# Patient Record
Sex: Female | Born: 2000 | Race: White | Hispanic: No | Marital: Single | Smoking: Never smoker
Health system: Southern US, Community
[De-identification: ages and names within clinical notes are randomized; demographics above are authoritative.]

## PROBLEM LIST (undated history)

## (undated) DIAGNOSIS — F603 Borderline personality disorder: Secondary | ICD-10-CM

## (undated) DIAGNOSIS — F32A Depression, unspecified: Secondary | ICD-10-CM

## (undated) DIAGNOSIS — F419 Anxiety disorder, unspecified: Secondary | ICD-10-CM

---

## 2019-06-01 ENCOUNTER — Other Ambulatory Visit: Payer: Self-pay

## 2019-06-01 DIAGNOSIS — Z20822 Contact with and (suspected) exposure to covid-19: Secondary | ICD-10-CM

## 2019-06-02 LAB — NOVEL CORONAVIRUS, NAA: SARS-CoV-2, NAA: NOT DETECTED

## 2020-07-14 ENCOUNTER — Emergency Department
Admission: EM | Admit: 2020-07-14 | Discharge: 2020-07-14 | Disposition: A | Discharge status: Home / Self Care | Payer: PRIVATE HEALTH INSURANCE | Attending: Emergency Medicine | Admitting: Emergency Medicine

## 2020-07-14 ENCOUNTER — Emergency Department: Payer: PRIVATE HEALTH INSURANCE

## 2020-07-14 ENCOUNTER — Telehealth: Payer: Self-pay | Admitting: Emergency Medicine

## 2020-07-14 DIAGNOSIS — Z79899 Other long term (current) drug therapy: Secondary | ICD-10-CM | POA: Insufficient documentation

## 2020-07-14 DIAGNOSIS — R109 Unspecified abdominal pain: Secondary | ICD-10-CM | POA: Diagnosis present

## 2020-07-14 DIAGNOSIS — N200 Calculus of kidney: Secondary | ICD-10-CM | POA: Diagnosis not present

## 2020-07-14 HISTORY — DX: Borderline personality disorder: F60.3

## 2020-07-14 HISTORY — DX: Depression, unspecified: F32.A

## 2020-07-14 HISTORY — DX: Anxiety disorder, unspecified: F41.9

## 2020-07-14 LAB — URINALYSIS, COMPLETE (UACMP) WITH MICROSCOPIC
Bacteria, UA: NONE SEEN
Bilirubin Urine: NEGATIVE
Glucose, UA: NEGATIVE mg/dL
Ketones, ur: NEGATIVE mg/dL
Leukocytes,Ua: NEGATIVE
Nitrite: NEGATIVE
Protein, ur: 100 mg/dL — AB
RBC / HPF: >50 RBC/hpf — ABNORMAL HIGH (ref 0–5)
Specific Gravity, Urine: 1.018 (ref 1.005–1.030)
pH: 5 (ref 5.0–8.0)

## 2020-07-14 LAB — CBC WITH DIFFERENTIAL/PLATELET
Abs Immature Granulocytes: 0.03 10*3/uL (ref 0.00–0.07)
Basophils Absolute: 0 10*3/uL (ref 0.0–0.1)
Basophils Relative: 1 %
Eosinophils Absolute: 0.1 10*3/uL (ref 0.0–0.5)
Eosinophils Relative: 1 %
HCT: 41.5 % (ref 36.0–46.0)
Hemoglobin: 13.7 g/dL (ref 12.0–15.0)
Immature Granulocytes: 1 %
Lymphocytes Relative: 25 %
Lymphs Abs: 1.7 10*3/uL (ref 0.7–4.0)
MCH: 28.4 pg (ref 26.0–34.0)
MCHC: 33 g/dL (ref 30.0–36.0)
MCV: 86.1 fL (ref 80.0–100.0)
Monocytes Absolute: 0.5 10*3/uL (ref 0.1–1.0)
Monocytes Relative: 7 %
Neutro Abs: 4.3 10*3/uL (ref 1.7–7.7)
Neutrophils Relative %: 65 %
Platelets: 333 10*3/uL (ref 150–400)
RBC: 4.82 MIL/uL (ref 3.87–5.11)
RDW: 11.9 % (ref 11.5–15.5)
WBC: 6.6 10*3/uL (ref 4.0–10.5)
nRBC: 0 % (ref 0.0–0.2)

## 2020-07-14 LAB — BASIC METABOLIC PANEL
Anion gap: 6 (ref 5–15)
BUN: 9 mg/dL (ref 6–20)
CO2: 29 mmol/L (ref 22–32)
Calcium: 9.8 mg/dL (ref 8.9–10.3)
Chloride: 103 mmol/L (ref 98–111)
Creatinine, Ser: 0.85 mg/dL (ref 0.44–1.00)
GFR, Estimated: >60 mL/min (ref >60)
Glucose, Bld: 91 mg/dL (ref 70–99)
Potassium: 4.7 mmol/L (ref 3.5–5.1)
Sodium: 138 mmol/L (ref 135–145)

## 2020-07-14 LAB — PREGNANCY, URINE: Preg Test, Ur: NEGATIVE

## 2020-07-14 MED ORDER — TAMSULOSIN HCL 0.4 MG PO CAPS: 0.4000 mg | ORAL_CAPSULE | Freq: Every day | ORAL | 0 refills | Status: AC

## 2020-07-14 MED ORDER — SODIUM CHLORIDE 0.9 % IV BOLUS
1000.0000 mL | Freq: Once | INTRAVENOUS | Status: AC
  Administered 2020-07-14: 1000 mL via INTRAVENOUS

## 2020-07-14 MED ORDER — ONDANSETRON HCL 4 MG/2ML IJ SOLN
4.0000 mg | Freq: Once | INTRAMUSCULAR | Status: AC
  Administered 2020-07-14: 4 mg via INTRAVENOUS
  Filled 2020-07-14: qty 2

## 2020-07-14 MED ORDER — TAMSULOSIN HCL 0.4 MG PO CAPS: 0.4000 mg | ORAL_CAPSULE | Freq: Every day | ORAL | 0 refills | Status: DC

## 2020-07-14 MED ORDER — HYDROMORPHONE HCL 1 MG/ML IJ SOLN
0.5000 mg | Freq: Once | INTRAMUSCULAR | Status: AC
  Administered 2020-07-14: 0.5 mg via INTRAVENOUS
  Filled 2020-07-14: qty 1

## 2020-07-14 MED ORDER — IBUPROFEN 600 MG PO TABS: 600.0000 mg | ORAL_TABLET | Freq: Three times a day (TID) | ORAL | 0 refills | Status: DC | PRN

## 2020-07-14 MED ORDER — IBUPROFEN 600 MG PO TABS: 600.0000 mg | ORAL_TABLET | Freq: Three times a day (TID) | ORAL | 0 refills | Status: AC | PRN

## 2020-07-14 MED ORDER — KETOROLAC TROMETHAMINE 30 MG/ML IJ SOLN
15.0000 mg | Freq: Once | INTRAMUSCULAR | Status: AC
  Administered 2020-07-14: 15 mg via INTRAVENOUS
  Filled 2020-07-14: qty 1

## 2020-07-14 MED ORDER — ONDANSETRON 4 MG PO TBDP: 4.0000 mg | ORAL_TABLET | Freq: Three times a day (TID) | ORAL | 0 refills | Status: DC | PRN

## 2020-07-14 MED ORDER — CEPHALEXIN 500 MG PO CAPS: 500.0000 mg | ORAL_CAPSULE | Freq: Three times a day (TID) | ORAL | 0 refills | Status: DC

## 2020-07-14 MED ORDER — CEPHALEXIN 500 MG PO CAPS: 500.0000 mg | ORAL_CAPSULE | Freq: Three times a day (TID) | ORAL | 0 refills | Status: AC

## 2020-07-14 MED ORDER — ONDANSETRON 4 MG PO TBDP: 4.0000 mg | ORAL_TABLET | Freq: Three times a day (TID) | ORAL | 0 refills | Status: AC | PRN

## 2020-07-14 NOTE — ED Provider Notes (Signed)
[facility] Emergency Department Provider Note  ____________________________________________   First MD Initiated Contact with Patient 07/14/20 1152     (approximate)  I have reviewed the triage vital signs and the nursing notes.   HISTORY  Chief Complaint Flank Pain    HPI Sydney Brady is a 19 y.o. female with history of borderline personality disorder, recurrent kidney stones, here with suspected kidney stone.  The patient states her symptoms began fairly acutely this morning.  She experienced acute onset of left flank pain, which has now radiated more towards her left lower flank.  She said associated nausea but no vomiting.  She has a history of recurrent kidney stones, greater than 10, and has been able to pass these all without requiring intervention.  Denies any known fevers or chills.  She had no preceding urinary symptoms.  No other acute complaints.  No specific alleviating aggravating factors.        Past Medical History:  Diagnosis Date  . Anxiety   . Borderline personality disorder (HCC)   . Depression     There are no problems to display for this patient.   History reviewed. No pertinent surgical history.  Prior to Admission medications   Medication Sig Start Date End Date Taking? Authorizing Provider  buPROPion (WELLBUTRIN XL) 300 MG 24 hr tablet Take 300 mg by mouth at bedtime. 07/04/20  Yes [provider]  desvenlafaxine (PRISTIQ) 100 MG 24 hr tablet Take 100 mg by mouth daily. 06/15/20  Yes [provider]  NUVARING 0.12-0.015 MG/24HR vaginal ring INSERT 1 RING VAGINALLY FOR 3 WEEKS THEN REMOVE FOR 1 WEEK 06/12/20  Yes [provider]  traZODone (DESYREL) 50 MG tablet Take 50 mg by mouth at bedtime. 06/28/20  Yes [provider]  VYVANSE 60 MG capsule Take 60 mg by mouth at bedtime. 06/22/20  Yes [provider]  cephALEXin (KEFLEX) 500 MG capsule Take 1 capsule (500 mg total) by mouth  3 (three) times daily for 7 days. 07/14/20 07/21/20  Shaune Pollack, MD  ibuprofen (ADVIL) 600 MG tablet Take 1 tablet (600 mg total) by mouth every 8 (eight) hours as needed for moderate pain. 07/14/20   Shaune Pollack, MD  ondansetron (ZOFRAN ODT) 4 MG disintegrating tablet Take 1 tablet (4 mg total) by mouth every 8 (eight) hours as needed for nausea or vomiting. 07/14/20   Shaune Pollack, MD  tamsulosin (FLOMAX) 0.4 MG CAPS capsule Take 1 capsule (0.4 mg total) by mouth daily for 5 days. 07/14/20 07/19/20  Shaune Pollack, MD    Allergies Coconut fatty acids  No family history on file.  Social History Social History   Tobacco Use  . Smoking status: Never Smoker  . Smokeless tobacco: Never Used  Substance Use Topics  . Alcohol use: Not Currently  . Drug use: Never    Review of Systems  Review of Systems  Constitutional: Positive for fatigue. Negative for fever.  HENT: Negative for congestion and sore throat.   Eyes: Negative for visual disturbance.  Respiratory: Negative for cough and shortness of breath.   Cardiovascular: Negative for chest pain.  Gastrointestinal: Positive for nausea. Negative for abdominal pain, diarrhea and vomiting.  Genitourinary: Positive for flank pain and hematuria.  Musculoskeletal: Negative for back pain and neck pain.  Skin: Negative for rash and wound.  Neurological: Negative for weakness.  All other systems reviewed and are negative.    ____________________________________________  PHYSICAL EXAM:      VITAL SIGNS: ED  Triage Vitals  Enc Vitals Group     BP 07/14/20 0934 116/78     Pulse Rate 07/14/20 0934 (!) 105     Resp 07/14/20 0934 18     Temp 07/14/20 0934 98.5 F (36.9 C)     Temp Source 07/14/20 0934 Oral     SpO2 07/14/20 0934 96 %     Weight 07/14/20 0935 125 lb (56.7 kg)     Height 07/14/20 0935 5\' 3"  (1.6 m)     Head Circumference --      Peak Flow --      Pain Score 07/14/20 0935 2     Pain Loc --      Pain Edu? --        Excl. in GC? --      Physical Exam Vitals and nursing note reviewed.  Constitutional:      General: She is not in acute distress.    Appearance: She is well-developed.  HENT:     Head: Normocephalic and atraumatic.  Eyes:     Conjunctiva/sclera: Conjunctivae normal.  Cardiovascular:     Rate and Rhythm: Normal rate and regular rhythm.     Heart sounds: Normal heart sounds. No murmur heard.  No friction rub.  Pulmonary:     Effort: Pulmonary effort is normal. No respiratory distress.     Breath sounds: Normal breath sounds. No wheezing or rales.  Abdominal:     General: There is no distension.     Palpations: Abdomen is soft.     Tenderness: There is no abdominal tenderness.     Comments: Mild left flank tenderness, without overt CVA tenderness.  Musculoskeletal:     Cervical back: Neck supple.  Skin:    General: Skin is warm.     Capillary Refill: Capillary refill takes less than 2 seconds.  Neurological:     Mental Status: She is alert and oriented to person, place, and time.     Motor: No abnormal muscle tone.       ____________________________________________   LABS (all labs ordered are listed, but only abnormal results are displayed)  Labs Reviewed  URINALYSIS, COMPLETE (UACMP) WITH MICROSCOPIC - Abnormal; Notable for the following components:      Result Value   Color, Urine AMBER (*)    APPearance CLOUDY (*)    Hgb urine dipstick LARGE (*)    Protein, ur 100 (*)    RBC / HPF >50 (*)    All other components within normal limits  CBC WITH DIFFERENTIAL/PLATELET  BASIC METABOLIC PANEL  PREGNANCY, URINE    ____________________________________________  EKG:  ________________________________________  RADIOLOGY All imaging, including plain films, CT scans, and ultrasounds, independently reviewed by me, and interpretations confirmed via formal radiology reads.  ED MD interpretation:   Ultrasound renal: bilateral calculi, no hydro  Official  radiology report(s): 13/07/21 Renal  Result Date: 07/14/2020 CLINICAL DATA:  Left flank pain since 6 a.m. EXAM: RENAL / URINARY TRACT ULTRASOUND COMPLETE COMPARISON:  None. FINDINGS: Right Kidney: Renal measurements: 8.7 x 3.6 x 4.1 cm = volume: 69 mL. Echogenicity within normal limits. There is an echogenic focus in the inferior right kidney measuring 0.5 cm, likely a renal stone. No hydronephrosis. Left Kidney: Renal measurements: 9.2 x 5.3 x 4.5 cm = volume: 116 mL. Echogenicity within normal limits. There is an echogenic focus in the midpole measuring 0.6 cm which may represent a renal calculus. No hydronephrosis. Bladder: Appears normal for degree of bladder distention. Bilateral jets  visualized Other: None. IMPRESSION: Probable small bilateral renal calculi.  No hydronephrosis. Electronically Signed   By: Emmaline Kluver M.D.   On: 07/14/2020 13:48    ____________________________________________  PROCEDURES   Procedure(s) performed (including Critical Care):  Procedures  ____________________________________________  INITIAL IMPRESSION / MDM / ASSESSMENT AND PLAN / ED COURSE  As part of my medical decision making, I reviewed the following data within the electronic MEDICAL RECORD NUMBER Nursing notes reviewed and incorporated, Old chart reviewed, Notes from prior ED visits, and [place] Controlled Substance Database       *Sofie Schendel was evaluated in Emergency Department on 07/14/2020 for the symptoms described in the history of present illness. She was evaluated in the context of the global COVID-19 pandemic, which necessitated consideration that the patient might be at risk for infection with the SARS-CoV-2 virus that causes COVID-19. Institutional protocols and algorithms that pertain to the evaluation of patients at risk for COVID-19 are in a state of rapid change based on information released by regulatory bodies including the CDC and federal and state organizations. These policies and  algorithms were followed during the patient's care in the ED.  Some ED evaluations and interventions may be delayed as a result of limited staffing during the pandemic.*     Medical Decision Making: [age]-year-old female here with recurrent kidney stone.  History of similar episodes, with no history of prior instrumentation.  Patient is afebrile and hemodynamically stable.  She has no evidence of leukocytosis or infection.  She had no urinary symptoms prior to the onset of pain.  Renal function is normal.  Urinalysis does show significant hematuria and mild pyuria, but no bacteria and she has no fevers other signs suggest infection.  ____________________________________________  FINAL CLINICAL IMPRESSION(S) / ED DIAGNOSES  Final diagnoses:  Kidney stone     MEDICATIONS GIVEN DURING THIS VISIT:  Medications  sodium chloride 0.9 % bolus 1,000 mL (1,000 mLs Intravenous New Bag/Given 07/14/20 1317)  HYDROmorphone (DILAUDID) injection 0.5 mg (0.5 mg Intravenous Given 07/14/20 1316)  ondansetron (ZOFRAN) injection 4 mg (4 mg Intravenous Given 07/14/20 1315)  ketorolac (TORADOL) 30 MG/ML injection 15 mg (15 mg Intravenous Given 07/14/20 1314)     ED Discharge Orders         Ordered    tamsulosin (FLOMAX) 0.4 MG CAPS capsule  Daily        07/14/20 1350    ondansetron (ZOFRAN ODT) 4 MG disintegrating tablet  Every 8 hours PRN        07/14/20 1350    ibuprofen (ADVIL) 600 MG tablet  Every 8 hours PRN        07/14/20 1350    cephALEXin (KEFLEX) 500 MG capsule  3 times daily        07/14/20 1350           Note:  This document was prepared using Dragon voice recognition software and may include unintentional dictation errors.   Shaune Pollack, MD 07/14/20 1352

## 2020-07-14 NOTE — ED Triage Notes (Addendum)
Left flank pain and patient states "I have another kidney stone." States she has had several in the past and this is a kidney stone. Patient denies dysuria, urgency or frequency.

## 2020-07-14 NOTE — Discharge Instructions (Addendum)
Take the Ibuprofen and zofran as needed  Your urine did show a small amount of inflammation as well. This is likely related to the stone but in the case of infection, we will start an antibiotic to be safe.

## 2020-07-14 NOTE — ED Notes (Signed)
Ultrasound with pt Will start IV and give medications once U/S complete

## 2020-07-14 NOTE — Telephone Encounter (Signed)
Switched rx pharmacy

## 2020-07-19 ENCOUNTER — Other Ambulatory Visit: Payer: Self-pay | Admitting: Urology

## 2020-07-19 DIAGNOSIS — N200 Calculus of kidney: Secondary | ICD-10-CM

## 2020-07-22 ENCOUNTER — Other Ambulatory Visit: Payer: Self-pay

## 2020-07-22 ENCOUNTER — Ambulatory Visit
Admission: RE | Admit: 2020-07-22 | Discharge: 2020-07-22 | Disposition: A | Discharge status: Home / Self Care | Payer: PRIVATE HEALTH INSURANCE | Source: Ambulatory Visit | Attending: Urology | Admitting: Urology

## 2020-07-22 DIAGNOSIS — N200 Calculus of kidney: Secondary | ICD-10-CM

## 2020-12-03 ENCOUNTER — Other Ambulatory Visit: Payer: Self-pay | Admitting: Obstetrics & Gynecology

## 2020-12-03 ENCOUNTER — Other Ambulatory Visit: Payer: Self-pay | Admitting: Acute Care

## 2020-12-03 ENCOUNTER — Other Ambulatory Visit (HOSPITAL_COMMUNITY): Payer: Self-pay | Admitting: Obstetrics & Gynecology

## 2020-12-03 DIAGNOSIS — Z30433 Encounter for removal and reinsertion of intrauterine contraceptive device: Secondary | ICD-10-CM

## 2020-12-03 DIAGNOSIS — Z30430 Encounter for insertion of intrauterine contraceptive device: Secondary | ICD-10-CM

## 2020-12-16 ENCOUNTER — Ambulatory Visit: Admission: RE | Admit: 2020-12-16 | Payer: PRIVATE HEALTH INSURANCE | Source: Ambulatory Visit

## 2020-12-19 ENCOUNTER — Other Ambulatory Visit (HOSPITAL_COMMUNITY): Payer: Self-pay | Admitting: Obstetrics & Gynecology

## 2020-12-19 DIAGNOSIS — Z30430 Encounter for insertion of intrauterine contraceptive device: Secondary | ICD-10-CM

## 2020-12-19 DIAGNOSIS — Z30433 Encounter for removal and reinsertion of intrauterine contraceptive device: Secondary | ICD-10-CM

## 2021-01-09 ENCOUNTER — Other Ambulatory Visit: Payer: Self-pay

## 2022-04-17 IMAGING — CT CT RENAL STONE PROTOCOL
3 of 4 series · 9 of 46 positions shown, 16 images · non-contrast
Comparison: Renal ultrasound 07/14/2020 and abdominal radiographs
from 07/22/2020

CLINICAL DATA: Left flank pain.  Hematuria for 1 week.

EXAM:
CT ABDOMEN AND PELVIS WITHOUT CONTRAST
TECHNIQUE: Multidetector CT imaging of the abdomen and pelvis was performed
following the standard protocol without IV contrast.

[Series 4: lung bases · axial · 0.65mm/px · z∈[-197,-122]mm · 5 of 23 slices shown, 10 images]
[im 4/23  soft-tissue]
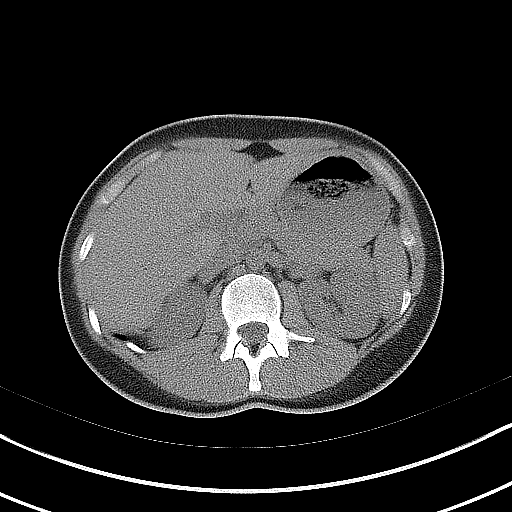
[im 4/23  bone]
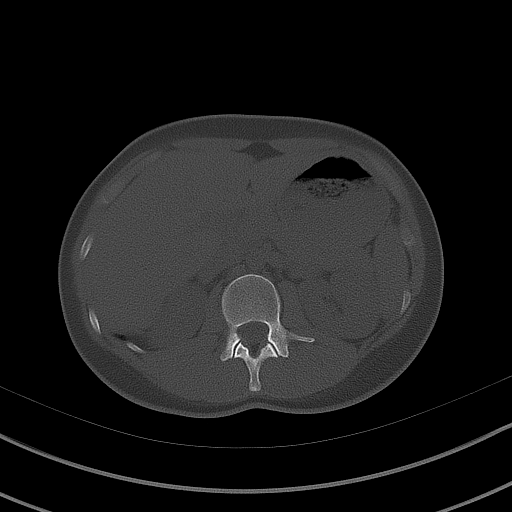
[im 8/23  soft-tissue]
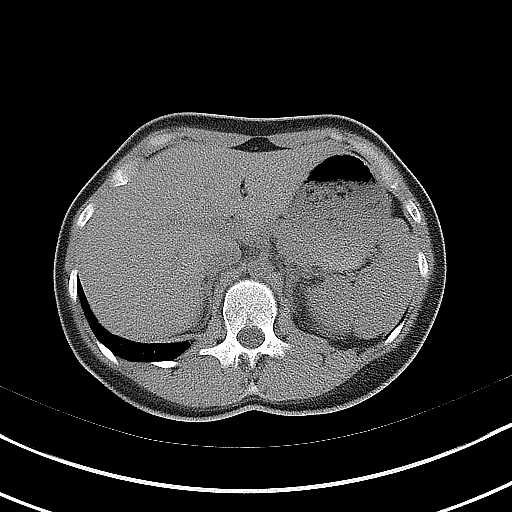
[im 8/23  lung]
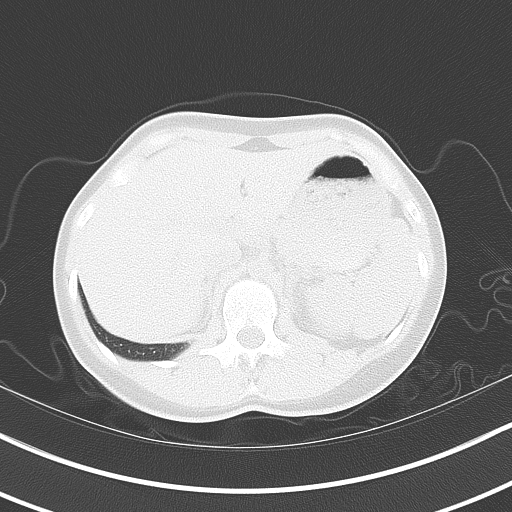
[im 12/23  soft-tissue]
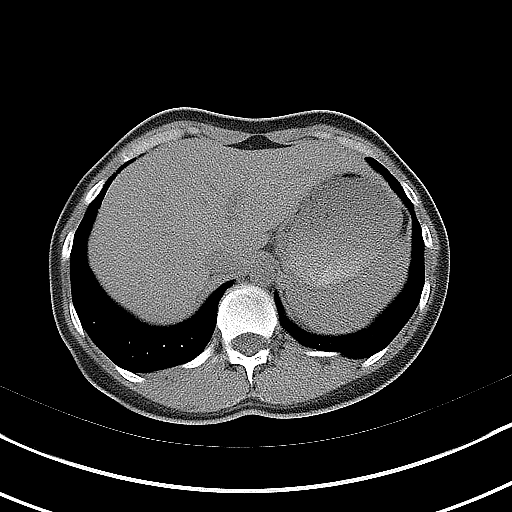
[im 12/23  lung]
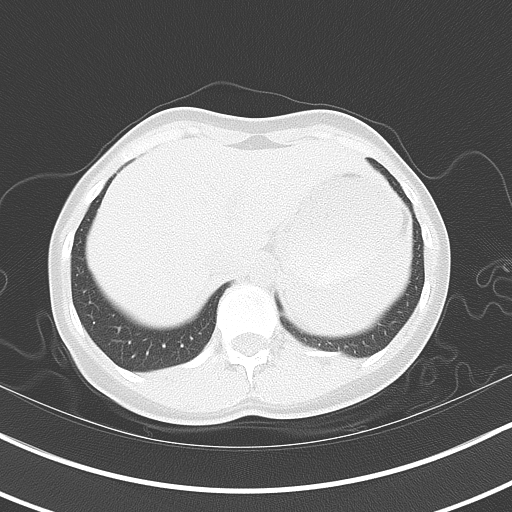
[im 15/23  soft-tissue]
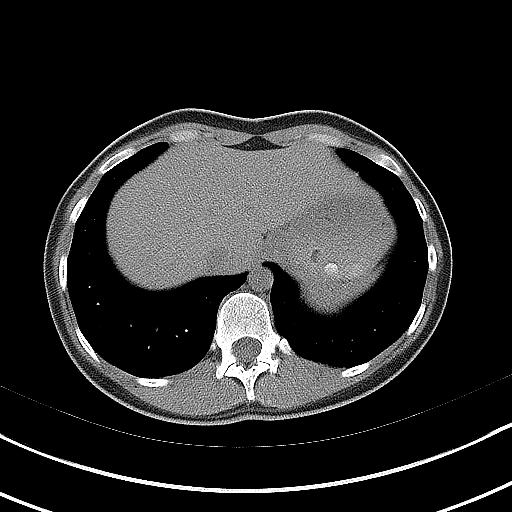
[im 15/23  lung]
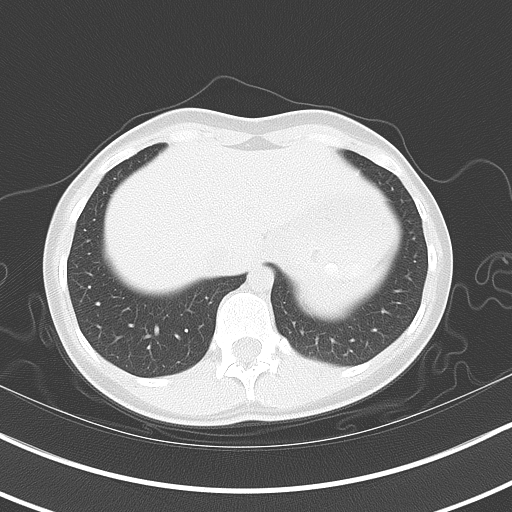
[im 19/23  soft-tissue]
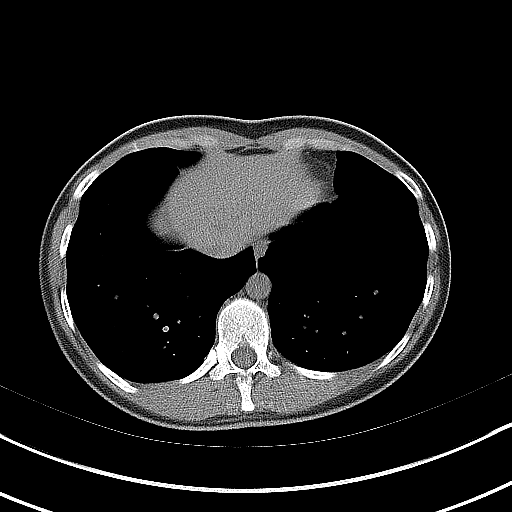
[im 19/23  lung]
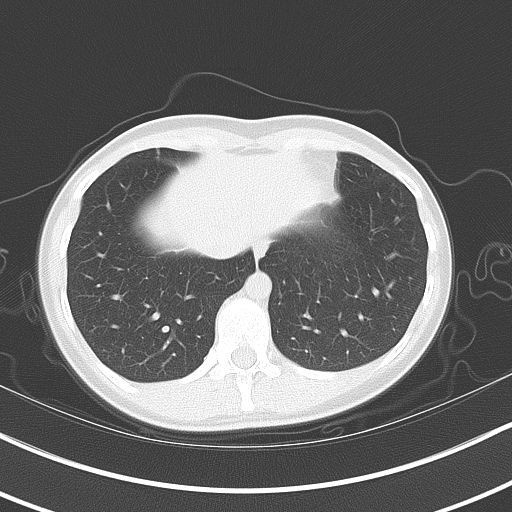

[Series 5: coronal · coronal · 0.68mm/px · 3 of 106 slices shown, 4 images]
[im 36/106  soft-tissue]
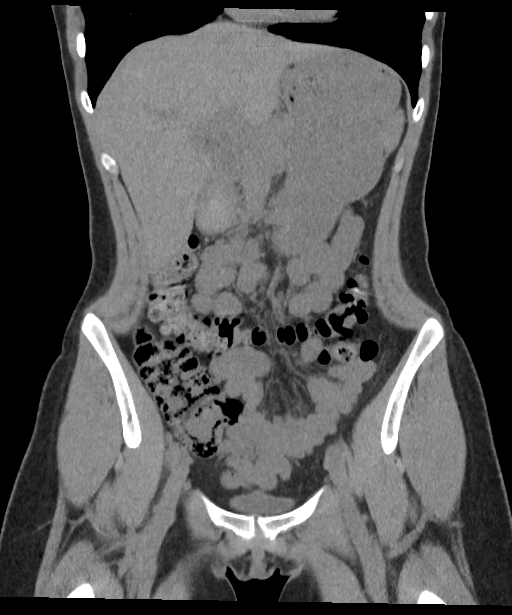
[im 47/106  soft-tissue]
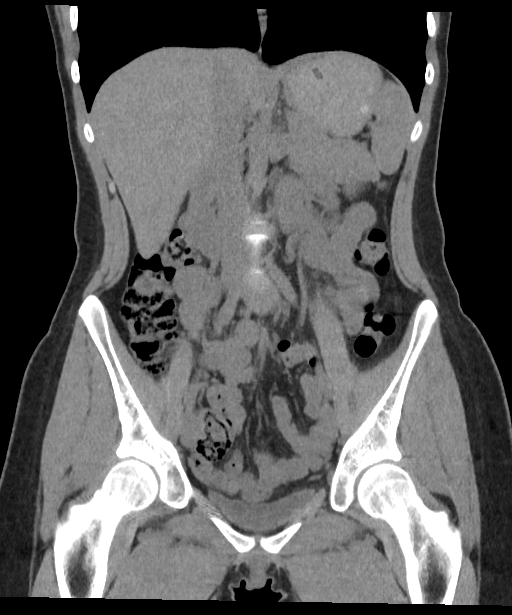
[im 47/106  bone]
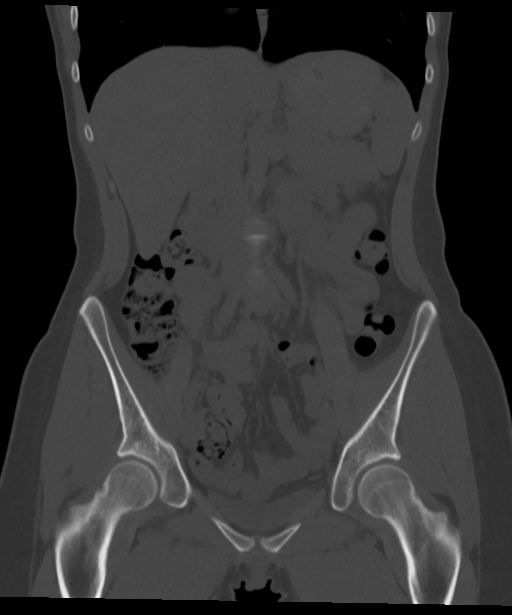
[im 59/106  soft-tissue]
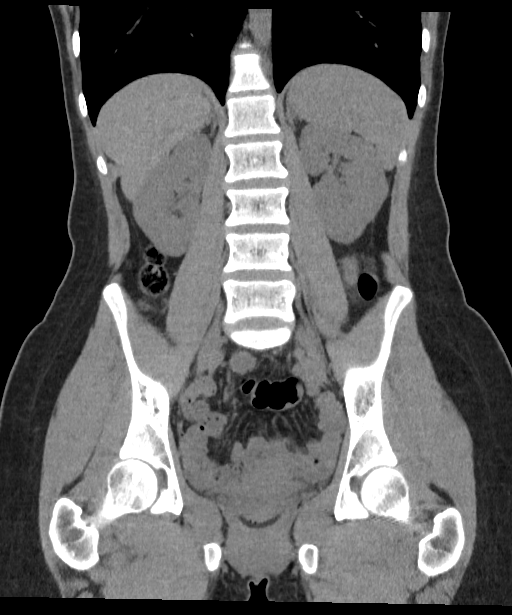

[Series 6: sagittal · sagittal · 0.47mm/px · 1 of 159 slices shown, 2 images]
[im 53/159  soft-tissue]
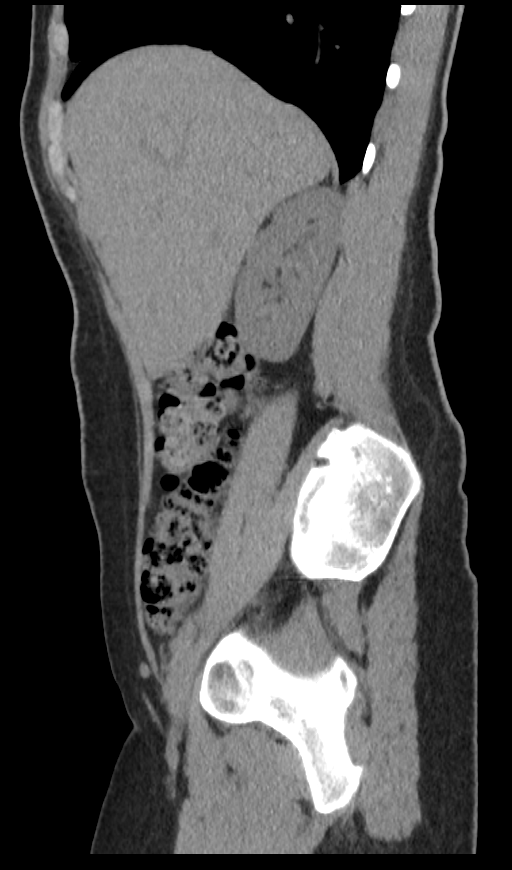
[im 53/159  bone]
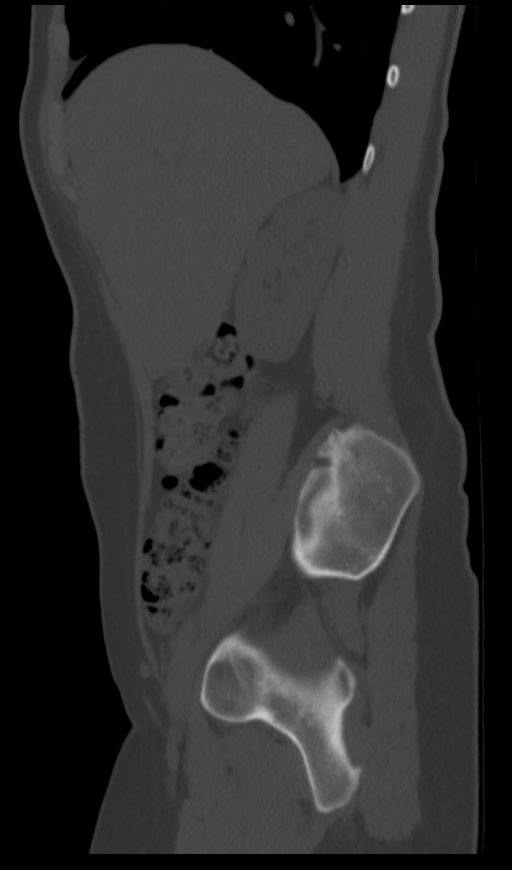

[9 of 46 positions shown; findings below may reference images not displayed]

FINDINGS: Lower chest: [DATE] by 0.3 by 0.4 cm subpleural nodule in the left
lower lobe on image 10 of series 4. Given the patient's age,
Fleischner criteria do not apply, and this lesion is considered
benign.

Hepatobiliary: Contracted gallbladder.  Otherwise unremarkable.

Pancreas: Unremarkable

Spleen: Unremarkable

Adrenals/Urinary Tract: Both adrenal glands appear normal. No
hydronephrosis or hydroureter.

A 0.4 by 0.2 cm nonobstructive calculus present in the right kidney
lower pole on image 29 of series 2.

Urinary bladder unremarkable.

Stomach/Bowel: Unremarkable.  The appendix appears normal.

Vascular/Lymphatic: Unremarkable

Reproductive: A NuvaRing-type device is present. No abnormality
observed.

Other: No supplemental non-categorized findings.

Musculoskeletal: Unremarkable
IMPRESSION: 1. 0.4 by 0.2 cm nonobstructive right kidney lower pole calculus.
Otherwise unremarkable exam.

## 2022-10-01 ENCOUNTER — Other Ambulatory Visit: Payer: Self-pay | Admitting: Obstetrics & Gynecology

## 2022-10-01 DIAGNOSIS — N926 Irregular menstruation, unspecified: Secondary | ICD-10-CM
